# Patient Record
Sex: Female | Born: 1990 | Race: White | Hispanic: No | Marital: Single | Smoking: Current every day smoker
Health system: Southern US, Community
[De-identification: ages and names within clinical notes are randomized; demographics above are authoritative.]

## PROBLEM LIST (undated history)

## (undated) ENCOUNTER — Inpatient Hospital Stay: Payer: Self-pay

## (undated) HISTORY — PX: FEMUR SURGERY: SHX943

## (undated) HISTORY — PX: ORBITAL FRACTURE SURGERY: SHX725

---

## 2011-01-25 ENCOUNTER — Emergency Department: Payer: Self-pay | Admitting: Emergency Medicine

## 2012-01-15 IMAGING — CR DG FEMUR 2V*R*
1 series · 5 of 5 positions shown · non-contrast
Comparison: none

REASON FOR EXAM: pain in area where rod is placed, feelslike leg is bowing
COMMENTS:   LMP: 2 weeks, no sex active

[Series 1: view not recorded · 0.17mm/px · 5 of 5 slices shown]
[im 1/5]
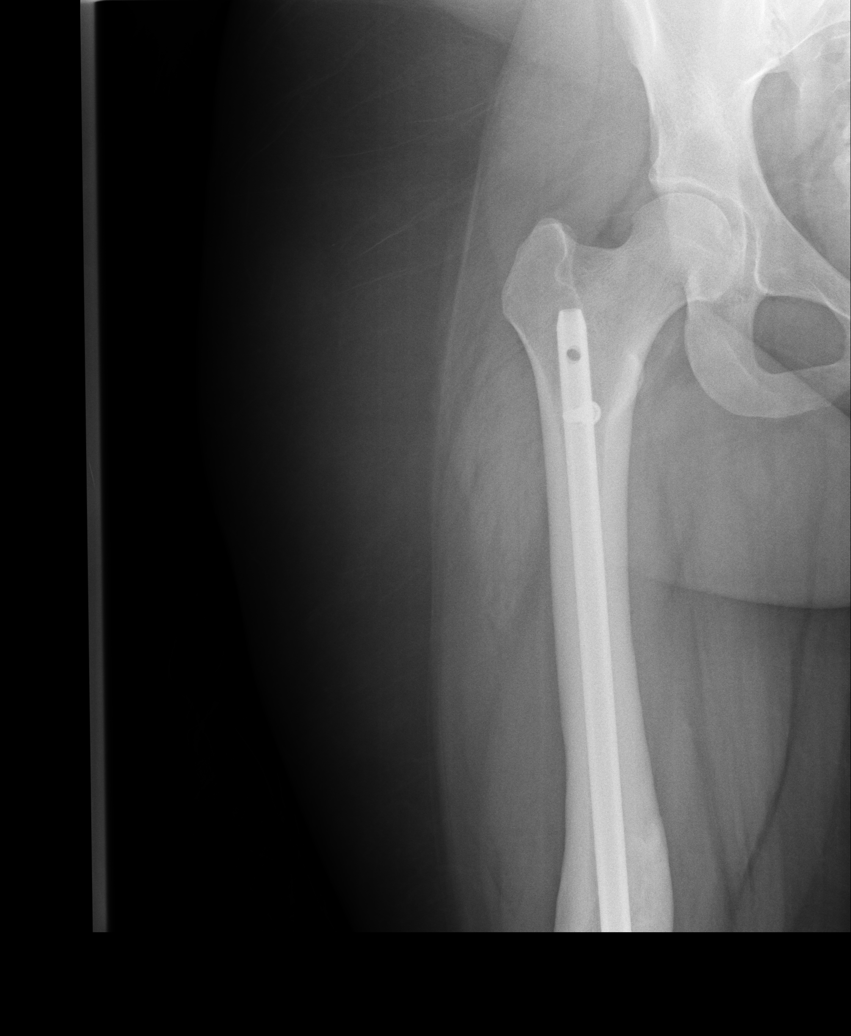
[im 2/5]
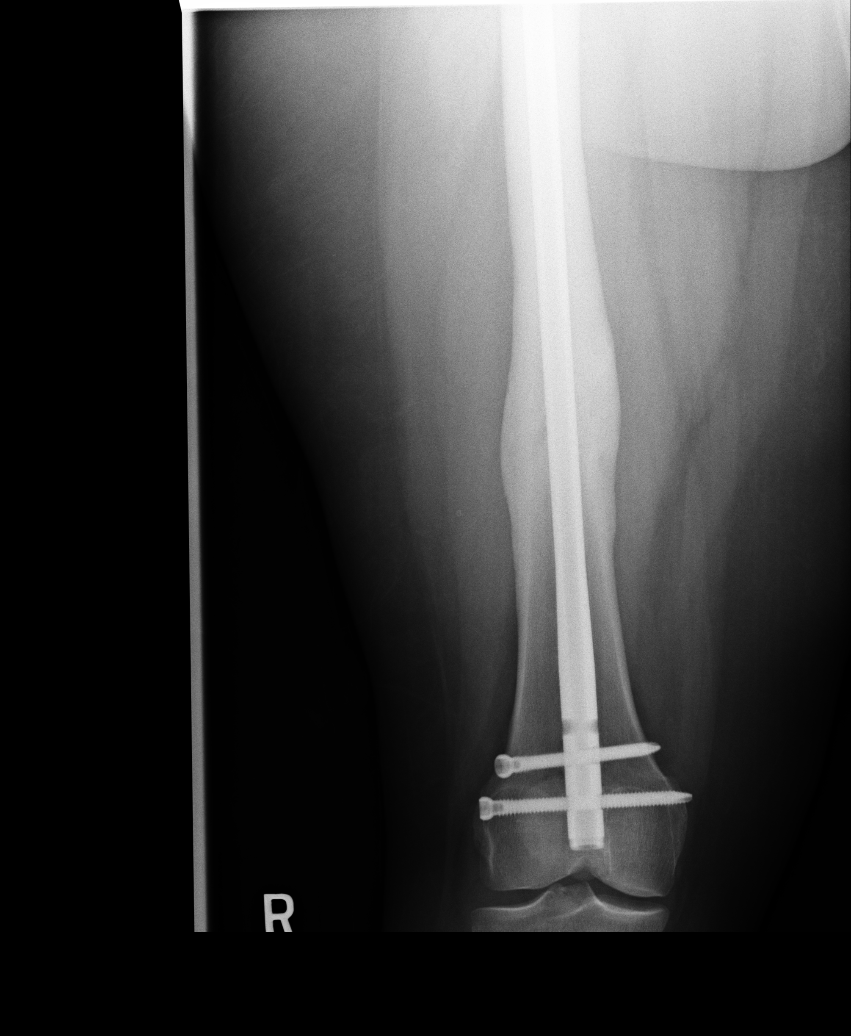
[im 3/5]
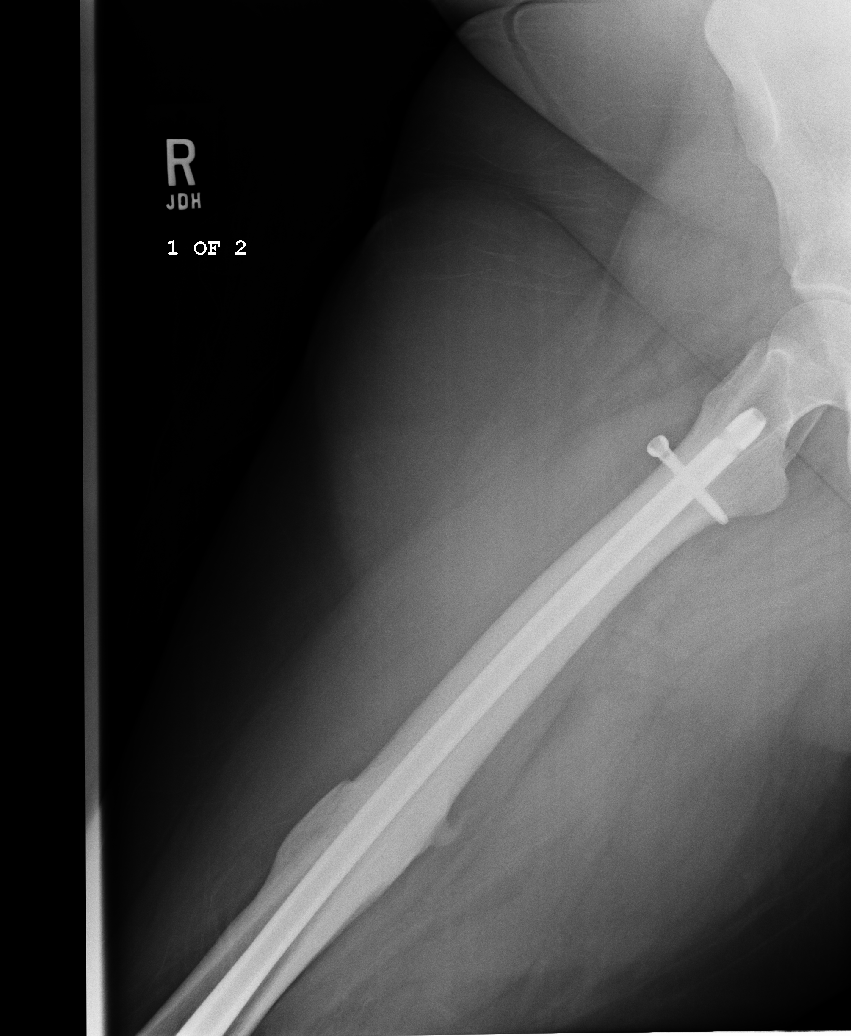
[im 4/5]
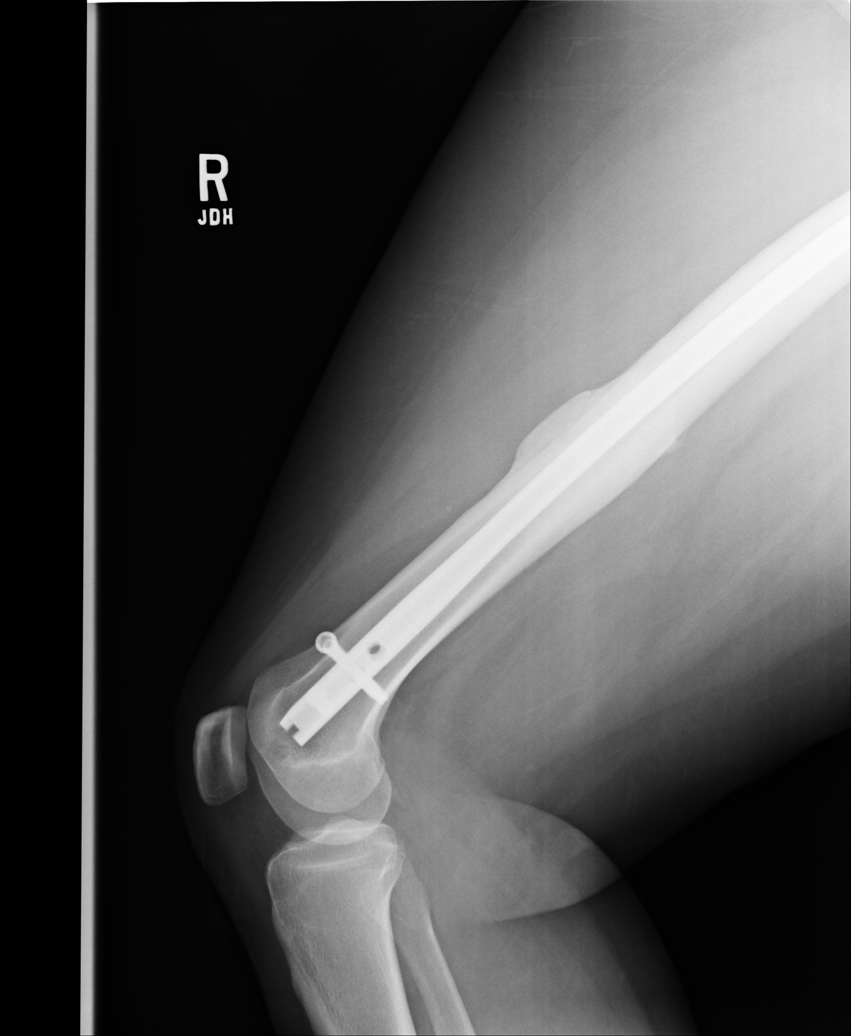
[im 5/5]
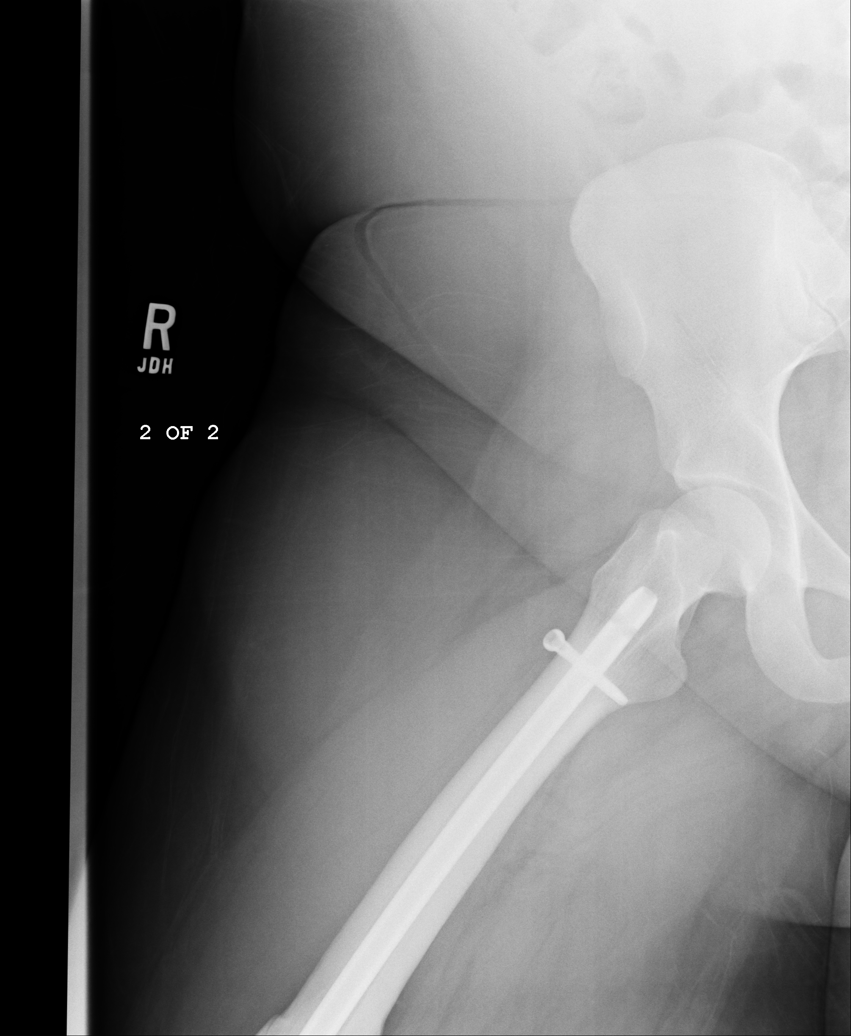

[5 of 5 positions shown; findings below may reference images not displayed]

PROCEDURE:     DXR - DXR FEMUR RIGHT  - January 25, 2011  [DATE]

RESULT:     Findings: AP and lateral views of the right femur demonstrates
no acute fracture or dislocation. There is an intramedullary nail with
proximal and distal transverse interlocking screws transfixing a healed
distal femoral diaphyseal fracture. The soft tissues are normal.
IMPRESSION: Please see above.

## 2012-08-22 ENCOUNTER — Emergency Department: Payer: Self-pay | Admitting: Emergency Medicine

## 2012-08-22 LAB — URINALYSIS, COMPLETE
Bilirubin,UR: NEGATIVE
Glucose,UR: NEGATIVE mg/dL (ref 0–75)
Nitrite: NEGATIVE
Protein: NEGATIVE
Specific Gravity: 1.029 (ref 1.003–1.030)
Squamous Epithelial: 47

## 2012-08-22 LAB — COMPREHENSIVE METABOLIC PANEL
Albumin: 3.9 g/dL (ref 3.4–5.0)
Anion Gap: 7 (ref 7–16)
BUN: 12 mg/dL (ref 7–18)
Calcium, Total: 8.7 mg/dL (ref 8.5–10.1)
Creatinine: 0.61 mg/dL (ref 0.60–1.30)
EGFR (African American): >60
SGOT(AST): 16 U/L (ref 15–37)
SGPT (ALT): 23 U/L (ref 12–78)
Sodium: 140 mmol/L (ref 136–145)

## 2012-08-22 LAB — CBC
HCT: 45.5 % (ref 35.0–47.0)
MCHC: 34.3 g/dL (ref 32.0–36.0)
MCV: 91 fL (ref 80–100)
Platelet: 206 10*3/uL (ref 150–440)
RBC: 4.98 10*6/uL (ref 3.80–5.20)
RDW: 12.6 % (ref 11.5–14.5)
WBC: 8.6 10*3/uL (ref 3.6–11.0)

## 2012-08-22 LAB — LIPASE, BLOOD: Lipase: 85 U/L (ref 73–393)

## 2013-10-06 ENCOUNTER — Ambulatory Visit: Payer: Self-pay

## 2013-10-08 ENCOUNTER — Ambulatory Visit: Payer: Self-pay

## 2014-07-01 NOTE — L&D Delivery Note (Signed)
Deliver Note   Date of Delivery:   12/30/2014 Primary OB:   [facility] Gestational Age/EDD: 7262w6d by 12/31/2014, by Other Basis  Antepartum complications:  OB History    Gravida Para Term Preterm AB TAB SAB Ectopic Multiple Living   1 1 1       0 1      Delivered By:   Vena AustriaStaebler, Roxene Alviar MD  Delivery Type:   TSVD Anesthesia:     None  Intrapartum complications:  GBS:    Negative (06/10 0000) Laceration:    none Episiotomy:    none Placenta:    Spontaneous Estimated Blood Loss:  250mL Baby:    Liveborn female , APGAR (1 MIN): 8   APGAR (5 MINS): 9   APGAR (10 MINS):  , weight 5lbs 10oz   Deliver Details   At  a female was delivered via  (Presentation:OA ).  APGAR: 8, 9; weight .   Placenta status: manual extraction, intact.  3 Vessel Cord:  with the following complications: meconium    Mom to postpartum.  Baby to Couplet care / Skin to Skin.

## 2014-08-25 ENCOUNTER — Emergency Department: Payer: Self-pay | Admitting: Emergency Medicine

## 2014-09-26 IMAGING — US ULTRASOUND LEFT BREAST
1 series · 7 of 7 positions shown · non-contrast
Comparison: None

ADDENDUM:
The patient returns for possible left breast ultrasound-guided
aspiration/core biopsy.

On repeat ultrasound evaluation of the area of palpable concern
within the left breast, the subdermal, hypoechoic fluid collection
is again evaluated. This is located in the region of the left
areolar border at the 2 o'clock position. On today's evaluation a
small sinus tract is visualized. Color-flow imaging shows slightly
increased color flow around the periphery of the area without color
flow within the lesion. The appearance is most consistent with a
sebaceous cyst ([REDACTED] gland cyst) in this region. As result,
ultrasound-guided core biopsy/ aspiration will not be performed this
time. The patient has been instructed that if this increases in size
or develops signs of infection, to contact our [REDACTED] and we
can arrange surgical referral for surgical excision of the lesion,
if needed. Breast self-examination was reviewed with the patient. I
recommend screening mammography at age 40.
2: Benign finding(s).
CLINICAL DATA: Patient presents for evaluation of palpable
thickening within the subareolar aspect of the left breast 2 o'clock
position.
EXAM:
ULTRASOUND OF THE LEFT BREAST

[Series 1: ultrasound left breast · 0.08mm/px · 7 of 7 slices shown]
[im 1/7]
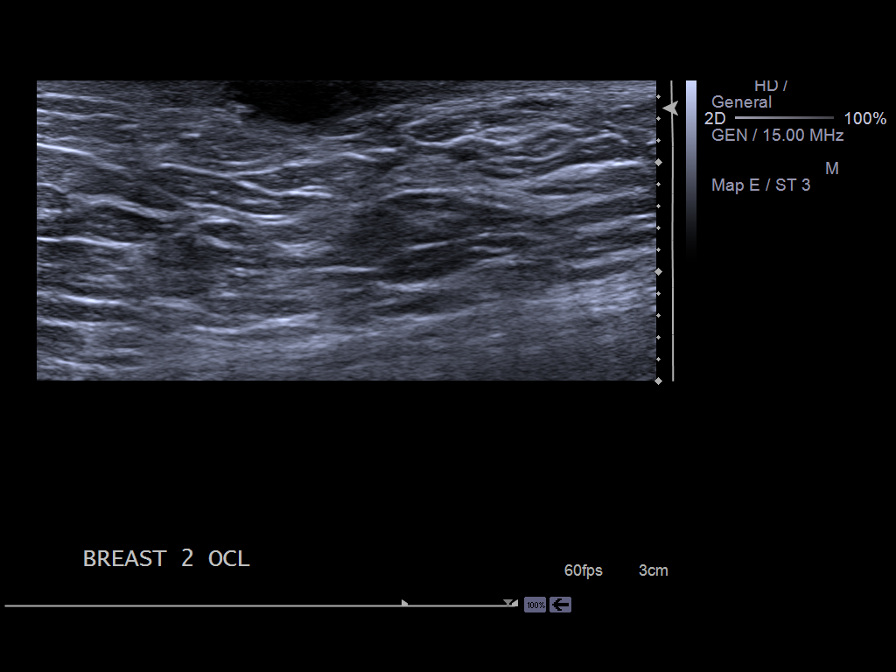
[im 2/7]
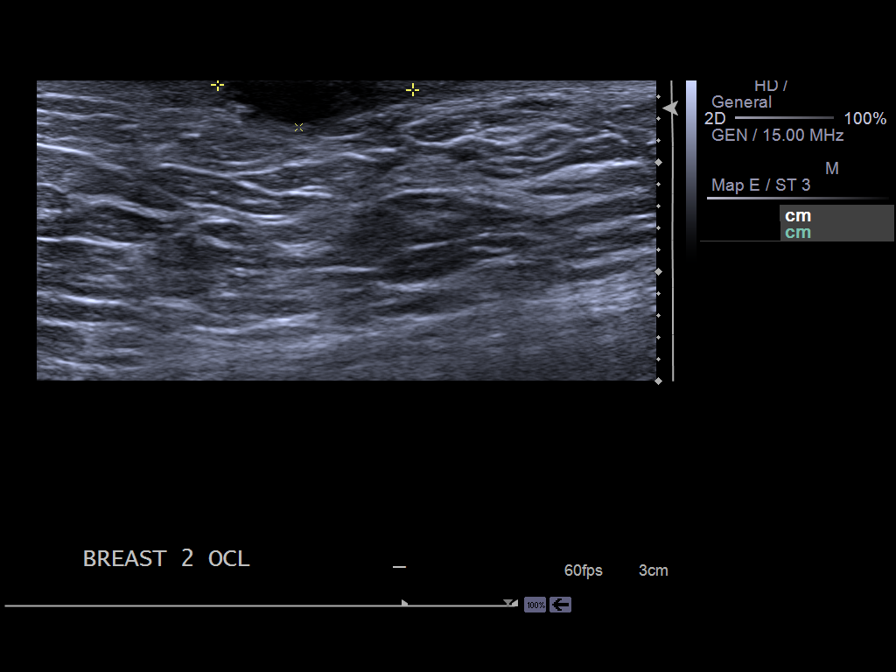
[im 3/7]
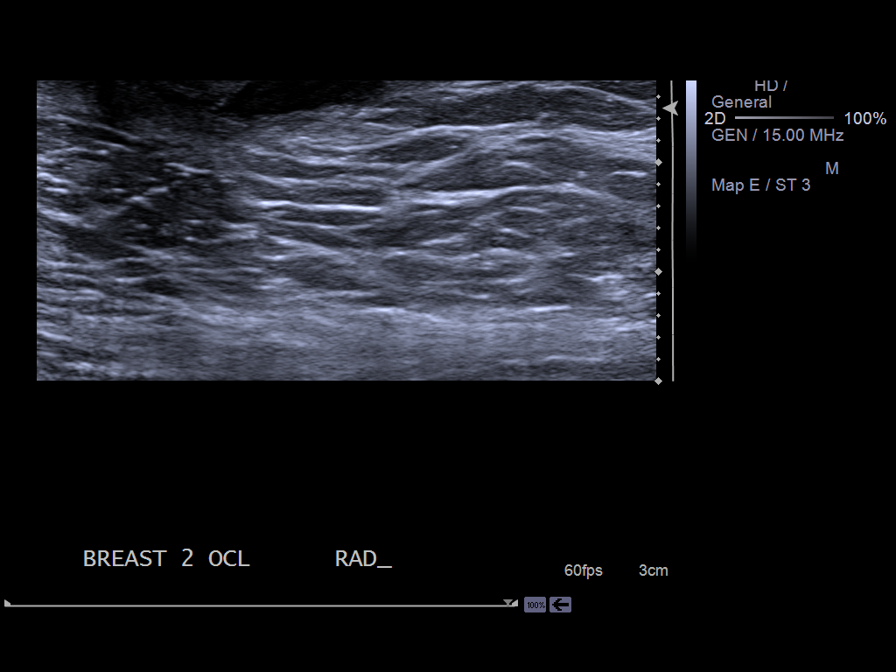
[im 4/7]
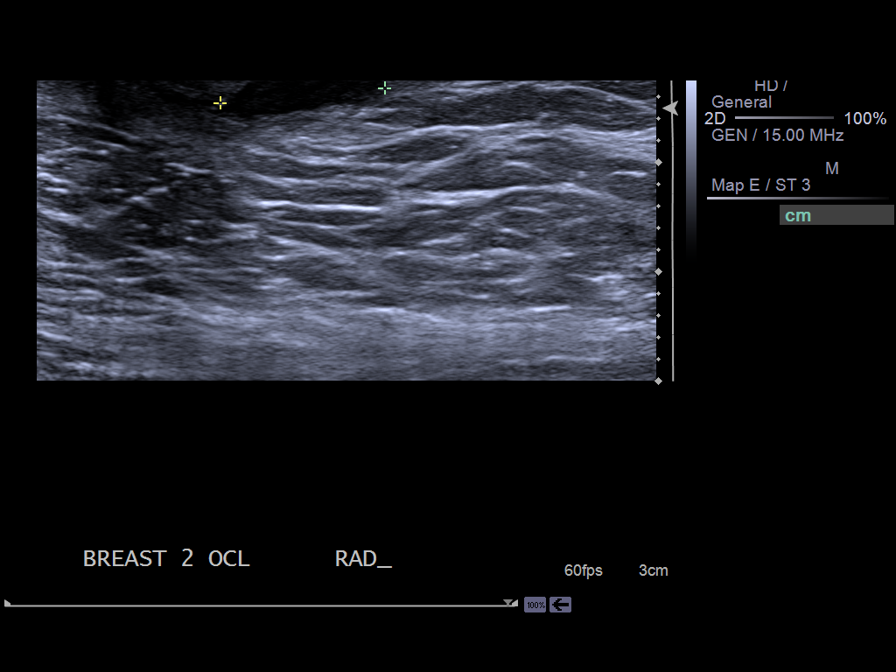
[im 5/7]
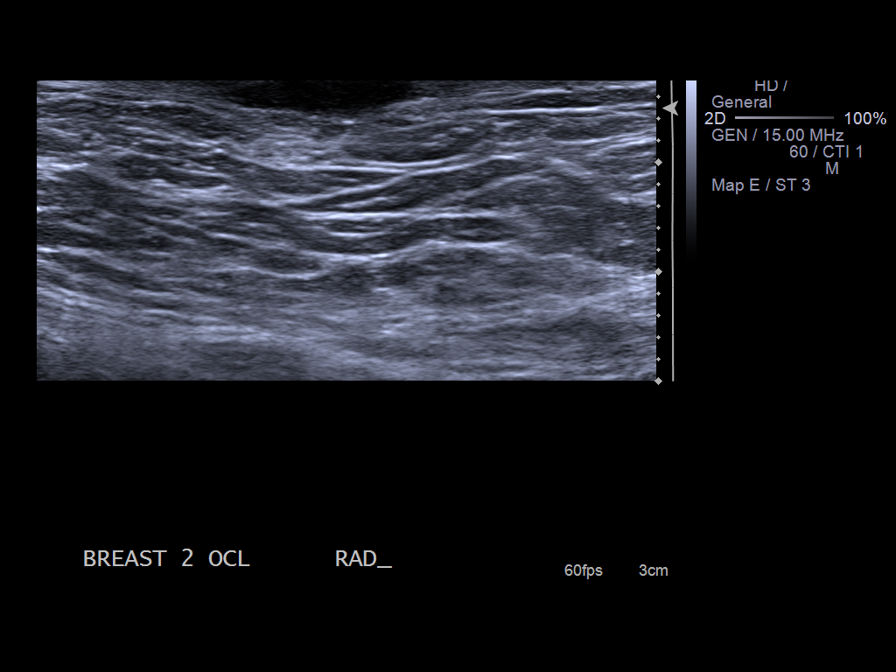
[im 6/7]
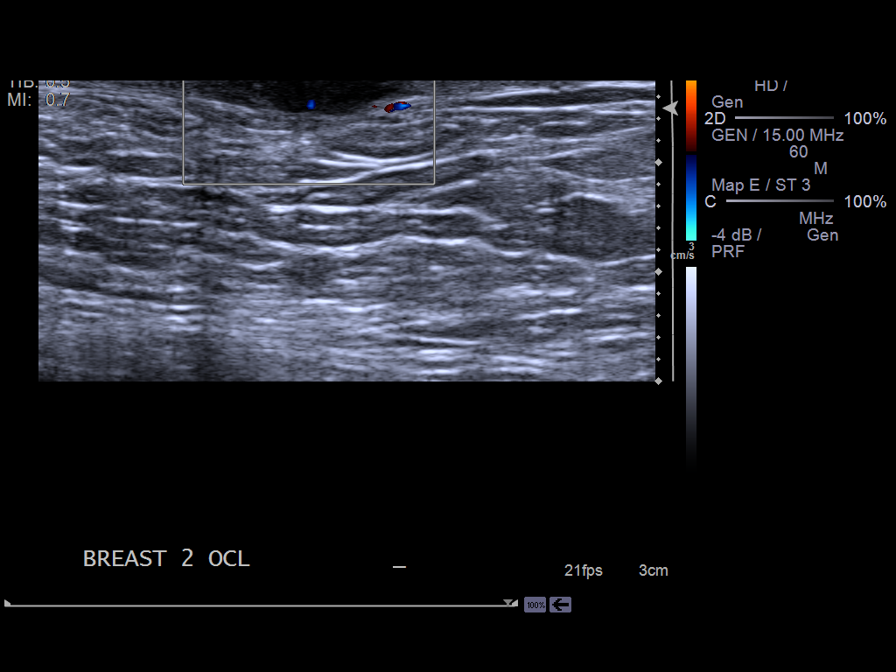
[im 7/7]
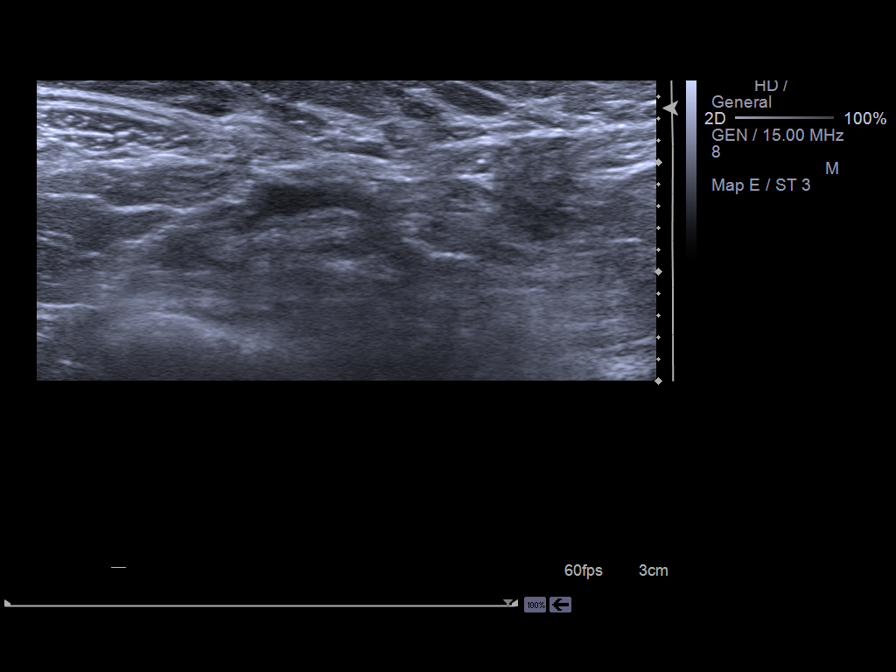

[7 of 7 positions shown; findings below may reference images not displayed]

FINDINGS: On physical exam,I palpate an area thickening 2 o'clock position
subareolar location..

Ultrasound is performed, showing a 1.8 x 0.5 x 1.5 cm hypoechoic
collection/mass within the subareolar left breast 2 o'clock position
corresponding with palpable abnormality. There is overlying skin
thickening. Additionally there is suggestion of peripheral color
vascular blood flow.
IMPRESSION: Palpable abnormality corresponds with a complicated fluid collection
versus solid mass within the subareolar left breast 2 o'clock
position. There is suggestion of peripheral vascular flow.

RECOMMENDATION:
Ultrasound-guided cyst aspiration/ biopsy of subareolar mass versus
fluid collection. This is scheduled for 10/08/2013 at 1 p.m..

I have discussed the findings and recommendations with the patient.
Results were also provided in writing at the conclusion of the
visit. If applicable, a reminder letter will be sent to the patient
regarding the next appointment.

BI-RADS CATEGORY  3: Probably benign finding(s) - short interval
follow-up suggested.

## 2014-09-28 IMAGING — US US BREAST CYST ASPIRATION 1ST CYST
1 series · 4 of 4 positions shown · non-contrast
Comparison: none

CLINICAL DATA: 2: Benign finding(s).

[Series 1: us breast cyst aspiration 1st cyst · 0.08mm/px · 4 of 4 slices shown]
[im 1/4]
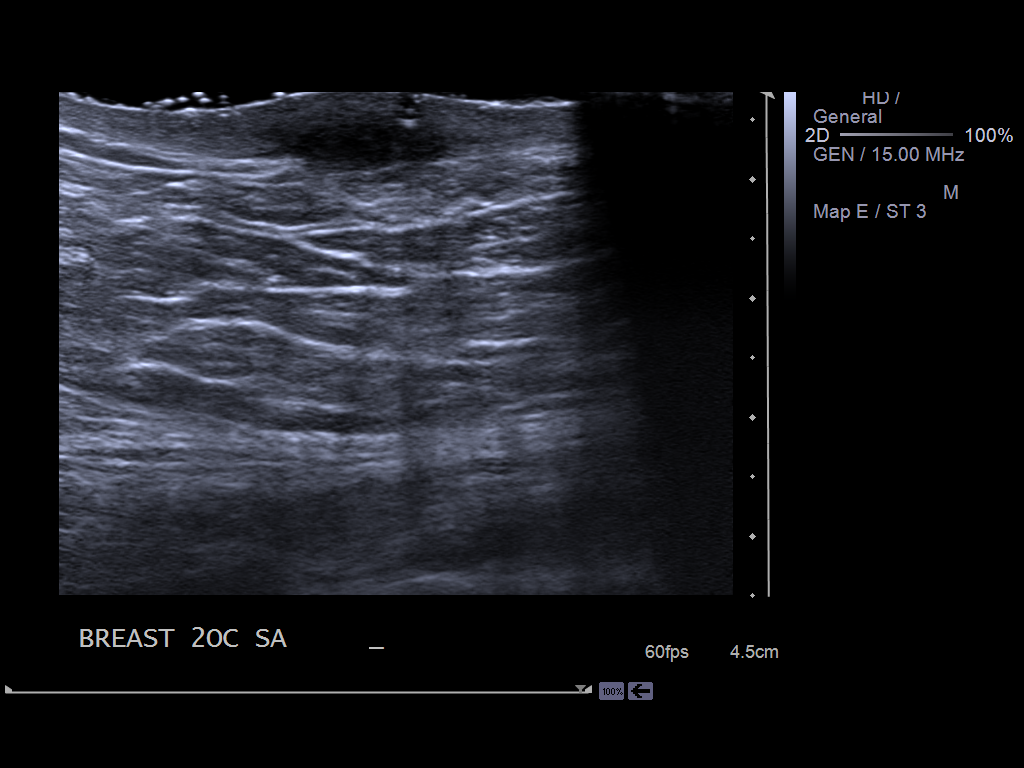
[im 2/4]
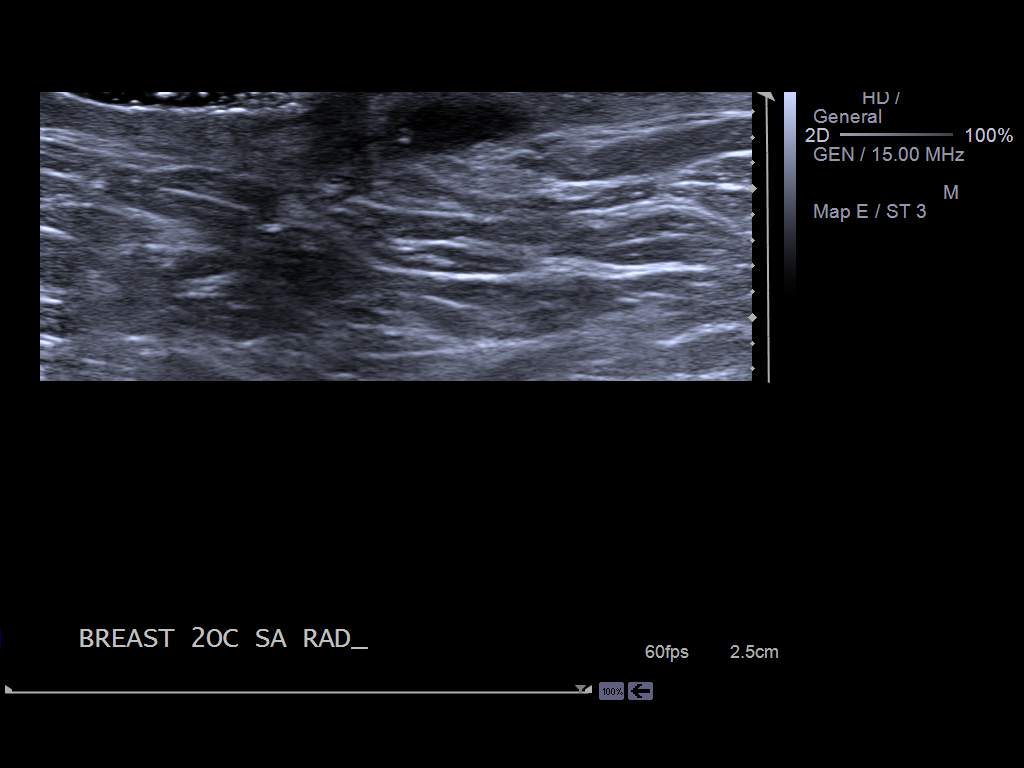
[im 3/4]
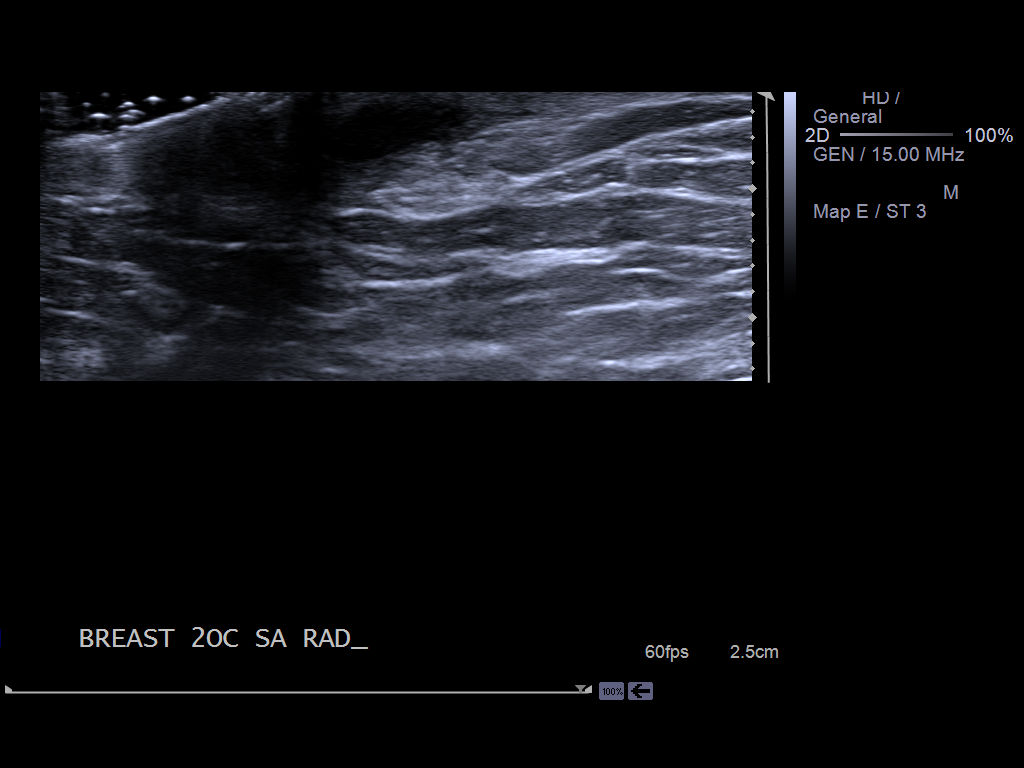
[im 4/4]
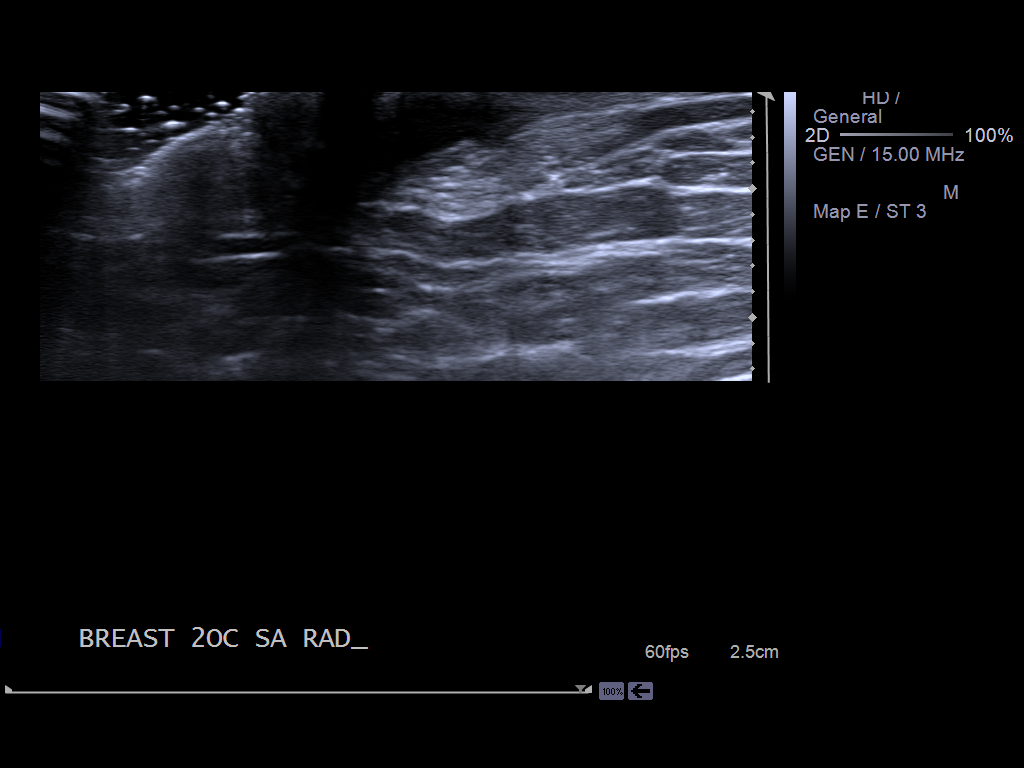

[4 of 4 positions shown; findings below may reference images not displayed]

RIGHT BREAST ULTRASOUND WAS PERFORMED PRIOR TO POSSIBLE RIGHT BREAST ULTRASOUND-GUIDED ASPIRATION/CORE BIOPSY.:
RIGHT BREAST ULTRASOUND WAS PERFORMED PRIOR TO POSSIBLE RIGHT BREAST
ULTRASOUND-GUIDED ASPIRATION/CORE BIOPSY.

Comparison with previous examinations performed.

PROCEDURE:
No core biopsy was performed.  Please see following evaluation:

On repeat ultrasound evaluation of the area of palpable concern
within the left breast, the subdermal, hypoechoic fluid collection
is again evaluated. This is located in the region of the left
areolar border at the 2 o'clock position. On today's evaluation a
small sinus tract is visualized. Color-flow imaging shows slightly
increased color flow around the periphery of the area without color
flow within the lesion. The appearance is most consistent with a
sebaceous cyst ([REDACTED] gland cyst) in this region. As result,
ultrasound-guided core biopsy/ aspiration will not be performed this
time. The patient has been instructed that if this increases in size
or develops signs of infection, to contact our [REDACTED] and we
can arrange surgical referral for surgical excision of the lesion,
if needed. Breast self-examination was reviewed with the patient. I
recommend screening mammography at age 40.
IMPRESSION: Left breast [REDACTED] gland cyst.

2: Benign finding(s).

## 2014-10-19 LAB — OB RESULTS CONSOLE RPR: RPR: NONREACTIVE

## 2014-10-19 LAB — OB RESULTS CONSOLE HIV ANTIBODY (ROUTINE TESTING): HIV: NONREACTIVE

## 2014-10-19 LAB — OB RESULTS CONSOLE ABO/RH: RH Type: NEGATIVE

## 2014-10-19 LAB — OB RESULTS CONSOLE RUBELLA ANTIBODY, IGM: Rubella: IMMUNE

## 2014-10-19 LAB — OB RESULTS CONSOLE GC/CHLAMYDIA
Chlamydia: NEGATIVE
GC PROBE AMP, GENITAL: NEGATIVE

## 2014-10-19 LAB — OB RESULTS CONSOLE HEPATITIS B SURFACE ANTIGEN: HEP B S AG: NEGATIVE

## 2014-10-19 LAB — OB RESULTS CONSOLE VARICELLA ZOSTER ANTIBODY, IGG: Varicella: IMMUNE

## 2014-12-09 LAB — OB RESULTS CONSOLE GBS: GBS: NEGATIVE

## 2014-12-09 LAB — OB RESULTS CONSOLE GC/CHLAMYDIA
Chlamydia: NEGATIVE
Gonorrhea: NEGATIVE

## 2014-12-28 ENCOUNTER — Encounter: Payer: Self-pay | Admitting: *Deleted

## 2014-12-28 ENCOUNTER — Observation Stay
Admission: EM | Admit: 2014-12-28 | Discharge: 2014-12-28 | Disposition: A | Discharge status: Home / Self Care | Payer: Medicaid Other | Attending: Advanced Practice Midwife | Admitting: Advanced Practice Midwife

## 2014-12-28 DIAGNOSIS — Z3A39 39 weeks gestation of pregnancy: Secondary | ICD-10-CM | POA: Insufficient documentation

## 2014-12-28 DIAGNOSIS — O368190 Decreased fetal movements, unspecified trimester, not applicable or unspecified: Secondary | ICD-10-CM | POA: Diagnosis present

## 2014-12-28 DIAGNOSIS — O368130 Decreased fetal movements, third trimester, not applicable or unspecified: Principal | ICD-10-CM | POA: Insufficient documentation

## 2014-12-28 NOTE — Discharge Instructions (Signed)
Vaginal Delivery °During delivery, your health care provider will help you give birth to your baby. During a vaginal delivery, you will work to push the baby out of your vagina. However, before you can push your baby out, a few things need to happen. The opening of your uterus (cervix) has to soften, thin out, and open up (dilate) all the way to 10 cm. Also, your baby has to move down from the uterus into your vagina.  °SIGNS OF LABOR  °Your health care provider will first need to make sure you are in labor. Signs of labor include:  °· Passing what is called the mucous plug before labor begins. This is a small amount of blood-stained mucus. °· Having regular, painful uterine contractions.   °· The time between contractions gets shorter.   °· The discomfort and pain gradually get more intense. °· Contraction pains get worse when walking and do not go away when resting.   °· Your cervix becomes thinner (effacement) and dilates. °BEFORE THE DELIVERY °Once you are in labor and admitted into the hospital or care center, your health care provider may do the following:  °· Perform a complete physical exam. °· Review any complications related to pregnancy or labor.  °· Check your blood pressure, pulse, temperature, and heart rate (vital signs).   °· Determine if, and when, the rupture of amniotic membranes occurred. °· Do a vaginal exam (using a sterile glove and lubricant) to determine:   °¨ The position (presentation) of the baby. Is the baby's head presenting first (vertex) in the birth canal (vagina), or are the feet or buttocks first (breech)?   °¨ The level (station) of the baby's head within the birth canal.   °¨ The effacement and dilatation of the cervix.   °· An electronic fetal monitor is usually placed on your abdomen when you first arrive. This is used to monitor your contractions and the baby's heart rate. °¨ When the monitor is on your abdomen (external fetal monitor), it can only pick up the frequency and  length of your contractions. It cannot tell the strength of your contractions. °¨ If it becomes necessary for your health care provider to know exactly how strong your contractions are or to see exactly what the baby's heart rate is doing, an internal monitor may be inserted into your vagina and uterus. Your health care provider will discuss the benefits and risks of using an internal monitor and obtain your permission before inserting the device. °¨ Continuous fetal monitoring may be needed if you have an epidural, are receiving certain medicines (such as oxytocin), or have pregnancy or labor complications. °· An IV access tube may be placed into a vein in your arm to deliver fluids and medicines if necessary. °THREE STAGES OF LABOR AND DELIVERY °Normal labor and delivery is divided into three stages. °First Stage °This stage starts when you begin to contract regularly and your cervix begins to efface and dilate. It ends when your cervix is completely open (fully dilated). The first stage is the longest stage of labor and can last from 3 hours to 15 hours.  °Several methods are available to help with labor pain. You and your health care provider will decide which option is best for you. Options include:  °· Opioid medicines. These are strong pain medicines that you can get through your IV tube or as a shot into your muscle. These medicines lessen pain but do not make it go away completely.  °· Epidural. A medicine is given through a thin tube that   is inserted in your back. The medicine numbs the lower part of your body and prevents any pain in that area.  Paracervical pain medicine. This is an injection of an anesthetic on each side of your cervix.   You may request natural childbirth, which does not involve the use of pain medicines or an epidural during labor and delivery. Instead, you will use other things, such as breathing exercises, to help cope with the pain. Second Stage The second stage of labor  begins when your cervix is fully dilated at 10 cm. It continues until you push your baby down through the birth canal and the baby is born. This stage can take only minutes or several hours.  The location of your baby's head as it moves through the birth canal is reported as a number called a station. If the baby's head has not started its descent, the station is described as being at minus 3 (-3). When your baby's head is at the zero station, it is at the middle of the birth canal and is engaged in the pelvis. The station of your baby helps indicate the progress of the second stage of labor.  When your baby is born, your health care provider may hold the baby with his or her head lowered to prevent amniotic fluid, mucus, and blood from getting into the baby's lungs. The baby's mouth and nose may be suctioned with a small bulb syringe to remove any additional fluid.  Your health care provider may then place the baby on your stomach. It is important to keep the baby from getting cold. To do this, the health care provider will dry the baby off, place the baby directly on your skin (with no blankets between you and the baby), and cover the baby with warm, dry blankets.   The umbilical cord is cut. Third Stage During the third stage of labor, your health care provider will deliver the placenta (afterbirth) and make sure your bleeding is under control. The delivery of the placenta usually takes about 5 minutes but can take up to 30 minutes. After the placenta is delivered, a medicine may be given either by IV or injection to help contract the uterus and control bleeding. If you are planning to breastfeed, you can try to do so now. After you deliver the placenta, your uterus should contract and get very firm. If your uterus does not remain firm, your health care provider will massage it. This is important because the contraction of the uterus helps cut off bleeding at the site where the placenta was attached  to your uterus. If your uterus does not contract properly and stay firm, you may continue to bleed heavily. If there is a lot of bleeding, medicines may be given to contract the uterus and stop the bleeding.  Document Released: 03/26/2008 Document Revised: 11/01/2013 Document Reviewed: 12/06/2012 [facility] Patient Information 2015 [employer], [place]. This information is not intended to replace advice given to you by your health care provider. Make sure you discuss any questions you have with your health care provider. Braxton Hicks Contractions Contractions of the uterus can occur throughout pregnancy. Contractions are not always a sign that you are in labor.  WHAT ARE BRAXTON HICKS CONTRACTIONS?  Contractions that occur before labor are called Braxton Hicks contractions, or false labor. Toward the end of pregnancy (32-34 weeks), these contractions can develop more often and may become more forceful. This is not true labor because these contractions do not result in opening (  dilatation) and thinning of the cervix. They are sometimes difficult to tell apart from true labor because these contractions can be forceful and people have different pain tolerances. You should not feel embarrassed if you go to the hospital with false labor. Sometimes, the only way to tell if you are in true labor is for your health care provider to look for changes in the cervix. If there are no prenatal problems or other health problems associated with the pregnancy, it is completely safe to be sent home with false labor and await the onset of true labor. HOW CAN YOU TELL THE DIFFERENCE BETWEEN TRUE AND FALSE LABOR? False Labor  The contractions of false labor are usually shorter and not as hard as those of true labor.   The contractions are usually irregular.   The contractions are often felt in the front of the lower abdomen and in the groin.   The contractions may go away when you walk around or change positions while  lying down.   The contractions get weaker and are shorter lasting as time goes on.   The contractions do not usually become progressively stronger, regular, and closer together as with true labor.  True Labor  Contractions in true labor last 30-70 seconds, become very regular, usually become more intense, and increase in frequency.   The contractions do not go away with walking.   The discomfort is usually felt in the top of the uterus and spreads to the lower abdomen and low back.   True labor can be determined by your health care provider with an exam. This will show that the cervix is dilating and getting thinner.  WHAT TO REMEMBER  Keep up with your usual exercises and follow other instructions given by your health care provider.   Take medicines as directed by your health care provider.   Keep your regular prenatal appointments.   Eat and drink lightly if you think you are going into labor.   If Braxton Hicks contractions are making you uncomfortable:   Change your position from lying down or resting to walking, or from walking to resting.   Sit and rest in a tub of warm water.   Drink 2-3 glasses of water. Dehydration may cause these contractions.   Do slow and deep breathing several times an hour.  WHEN SHOULD I SEEK IMMEDIATE MEDICAL CARE? Seek immediate medical care if:  Your contractions become stronger, more regular, and closer together.   You have fluid leaking or gushing from your vagina.   You have a fever.   You pass blood-tinged mucus.   You have vaginal bleeding.   You have continuous abdominal pain.   You have low back pain that you never had before.   You feel your baby's head pushing down and causing pelvic pressure.   Your baby is not moving as much as it used to.  Document Released: 06/17/2005 Document Revised: 06/22/2013 Document Reviewed: 03/29/2013 [facility] Patient Information 2015 [employer], [place]. This  information is not intended to replace advice given to you by your health care provider. Make sure you discuss any questions you have with your health care provider. LABOR: When contractions begin, you should start to time them from the beginning of one contraction to the beginning of the next.  When contractions are 5-10 minutes apart or less and have been regular for at least an hour, you should call your health care provider.  Notify your doctor if any of the following occur: 1.  Bleeding from the vagina 7. Sudden, constant, or occasional abdominal pain  2. Pain or burning when urinating 8. Sudden gushing of fluid from the vagina (with or without continued leaking)  3. Chills or fever 9. Fainting spells, "black outs" or loss of consciousness  4. Increase in vaginal discharge 10. Severe or continued nausea or vomiting  5. Pelvic pressure (sudden increase) 11. Blurring of vision or spots before the eyes  6. Baby moving less than usual 12. Leaking of fluid    FETAL KICK COUNT: Lie on your left side for one hour after a meal, and count the number of times your baby kicks. If it is less than 5 times, get up, move around and drink some juice. Repeat the test 30 minutes later. If it is still less than 5 kicks in an hour, notify your doctor.

## 2014-12-28 NOTE — Progress Notes (Signed)
Obstetric History and Physical  Alisha Park is a 24 y.o. G1P0 with Estimated Date of Delivery: 12/31/14 per 7wk US who presents at 6167w4d  presenting for decreased FM. Reports she typically feels baby move several times an hour, but has not felt any movement since this am. She denies change in activity today or decresed po intake. Denies regular ctx, LOF or VB.     Prenatal Course Source of Care: [facility]  with onset of care at 10 weeks Pregnancy complications or risks: Patient Active Problem List   Diagnosis Date Noted  . Decreased fetal movement 12/28/2014   She plans to breastfeed She desires IUD for postpartum contraception.   Prenatal labs and studies: ABO, Rh: B neg  Antibody: neg Rubella: Immune Varicella: Immune RPR:  neg HBsAg:  neg HIV: neg GC/CT: neg/neg GBS: neg 1 hr Glucola: 120   Genetic screening: negative 1st trimester screen    Prenatal Transfer Tool   No past medical history on file.  No past surgical history on file.  OB History  Gravida Para Term Preterm AB SAB TAB Ectopic Multiple Living  1             # Outcome Date GA Lbr Len/2nd Weight Sex Delivery Anes PTL Lv  1 Current               History   Social History  . Marital Status: Single    Spouse Name: N/A  . Number of Children: N/A  . Years of Education: N/A   Social History Main Topics  . Smoking status: Not on file  . Smokeless tobacco: Not on file  . Alcohol Use: Not on file  . Drug Use: Not on file  . Sexual Activity: Not on file   Other Topics Concern  . Not on file   Social History Narrative  . No narrative on file    No family history on file.  No prescriptions prior to admission    No Known Allergies  Review of Systems: Negative except for what is mentioned in HPI.  Physical Exam: BP 121/71 mmHg  Pulse 89  Temp(Src) 98.2 F (36.8 C) (Oral)  Resp 18 GENERAL: Well-developed, well-nourished female in no acute distress.  LUNGS: Clear to auscultation bilaterally.   HEART: Regular rate and rhythm. ABDOMEN: Soft, nontender, nondistended, gravid. EXTREMITIES: Nontender, no edema Cervical Exam: Dilatation 1 cm   Effacement 50 %   Station -3   Presentation: cephalic FHT: Category 1: Baseline rate 145 bpm   Variability moderate  Accelerations present   Decelerations none Contractions: irregular   Pertinent Labs/Studies:   No results found for this or any previous visit (from the past 24 hour(s)).  Assessment : IUP at 767w4d, decreased FM, category 1 fetal tracing  Plan: Discharge Home after 1 hour of reactive NST.  Pt has ROB appt tomorrow. Monitor fetal movement from now til then, if continued decreased FM may consider repeat NST and or BPP tomorrow in clinic. Pt encouraged to maintain po hydration.

## 2014-12-28 NOTE — OB Triage Note (Signed)
Patient states she has not felt the baby move since last night at 2230.

## 2014-12-30 ENCOUNTER — Encounter: Payer: Self-pay | Admitting: Certified Nurse Midwife

## 2014-12-30 ENCOUNTER — Encounter
Admission: EM | Disposition: A | Discharge status: Home / Self Care | Payer: Self-pay | Attending: Obstetrics and Gynecology

## 2014-12-30 ENCOUNTER — Observation Stay
Admission: EM | Admit: 2014-12-30 | Discharge: 2014-12-30 | Disposition: A | Discharge status: Home / Self Care | Payer: Medicaid Other | Source: Home / Self Care | Admitting: Obstetrics and Gynecology

## 2014-12-30 ENCOUNTER — Inpatient Hospital Stay
Admission: EM | Admit: 2014-12-30 | Discharge: 2015-01-01 | DRG: 775 | Disposition: A | Discharge status: Home / Self Care | Payer: Medicaid Other | Source: Intra-hospital | Attending: Obstetrics and Gynecology | Admitting: Obstetrics and Gynecology

## 2014-12-30 DIAGNOSIS — F17210 Nicotine dependence, cigarettes, uncomplicated: Secondary | ICD-10-CM | POA: Diagnosis present

## 2014-12-30 DIAGNOSIS — M25551 Pain in right hip: Secondary | ICD-10-CM | POA: Diagnosis present

## 2014-12-30 DIAGNOSIS — R262 Difficulty in walking, not elsewhere classified: Secondary | ICD-10-CM | POA: Diagnosis present

## 2014-12-30 DIAGNOSIS — Z9889 Other specified postprocedural states: Secondary | ICD-10-CM | POA: Diagnosis not present

## 2014-12-30 DIAGNOSIS — O368190 Decreased fetal movements, unspecified trimester, not applicable or unspecified: Secondary | ICD-10-CM | POA: Diagnosis present

## 2014-12-30 DIAGNOSIS — O99334 Smoking (tobacco) complicating childbirth: Secondary | ICD-10-CM | POA: Diagnosis present

## 2014-12-30 DIAGNOSIS — Z3A39 39 weeks gestation of pregnancy: Secondary | ICD-10-CM | POA: Diagnosis present

## 2014-12-30 LAB — CBC
HCT: 43.6 % (ref 35.0–47.0)
HEMOGLOBIN: 14.9 g/dL (ref 12.0–16.0)
MCH: 31 pg (ref 26.0–34.0)
MCHC: 34.1 g/dL (ref 32.0–36.0)
MCV: 90.9 fL (ref 80.0–100.0)
PLATELETS: 228 10*3/uL (ref 150–440)
RBC: 4.8 MIL/uL (ref 3.80–5.20)
RDW: 13 % (ref 11.5–14.5)
WBC: 21.2 10*3/uL — AB (ref 3.6–11.0)

## 2014-12-30 LAB — ABO/RH: ABO/RH(D): B NEG

## 2014-12-30 LAB — TYPE AND SCREEN
ABO/RH(D): B NEG
ANTIBODY SCREEN: NEGATIVE

## 2014-12-30 SURGERY — Surgical Case
Anesthesia: Choice

## 2014-12-30 MED ORDER — BUTORPHANOL TARTRATE 1 MG/ML IJ SOLN
INTRAMUSCULAR | Status: AC
  Administered 2014-12-30: 2 mg
  Filled 2014-12-30: qty 2

## 2014-12-30 MED ORDER — OXYTOCIN 10 UNIT/ML IJ SOLN
INTRAMUSCULAR | Status: AC
  Filled 2014-12-30: qty 2

## 2014-12-30 MED ORDER — ACETAMINOPHEN 325 MG PO TABS: 650.0000 mg | ORAL_TABLET | ORAL | Status: DC | PRN

## 2014-12-30 MED ORDER — LIDOCAINE HCL (PF) 1 % IJ SOLN
30.0000 mL | INTRAMUSCULAR | Status: DC | PRN
  Filled 2014-12-30: qty 30

## 2014-12-30 MED ORDER — MISOPROSTOL 200 MCG PO TABS
ORAL_TABLET | ORAL | Status: AC
  Filled 2014-12-30: qty 4

## 2014-12-30 MED ORDER — OXYTOCIN BOLUS FROM INFUSION: 500.0000 mL | INTRAVENOUS | Status: DC

## 2014-12-30 MED ORDER — ONDANSETRON HCL 4 MG/2ML IJ SOLN: 4.0000 mg | Freq: Four times a day (QID) | INTRAMUSCULAR | Status: DC | PRN

## 2014-12-30 MED ORDER — AMMONIA AROMATIC IN INHA
RESPIRATORY_TRACT | Status: AC
  Filled 2014-12-30: qty 10

## 2014-12-30 MED ORDER — BUTORPHANOL TARTRATE 1 MG/ML IJ SOLN
INTRAMUSCULAR | Status: AC
  Administered 2014-12-30: 1 mg
  Filled 2014-12-30: qty 1

## 2014-12-30 MED ORDER — FENTANYL CITRATE (PF) 100 MCG/2ML IJ SOLN
INTRAMUSCULAR | Status: AC
  Administered 2014-12-30: 100 ug via INTRAVENOUS
  Filled 2014-12-30: qty 2

## 2014-12-30 MED ORDER — BUTORPHANOL TARTRATE 1 MG/ML IJ SOLN: 1.0000 mg | INTRAMUSCULAR | Status: DC | PRN

## 2014-12-30 MED ORDER — LACTATED RINGERS IV SOLN: 500.0000 mL | INTRAVENOUS | Status: DC | PRN

## 2014-12-30 MED ORDER — OXYTOCIN 40 UNITS IN LACTATED RINGERS INFUSION - SIMPLE MED
62.5000 mL/h | INTRAVENOUS | Status: DC
  Administered 2014-12-30: 62.5 mL/h via INTRAVENOUS

## 2014-12-30 MED ORDER — LACTATED RINGERS IV SOLN
INTRAVENOUS | Status: DC
  Administered 2014-12-30: 125 mL/h via INTRAVENOUS

## 2014-12-30 MED ORDER — LIDOCAINE HCL (PF) 1 % IJ SOLN
INTRAMUSCULAR | Status: AC
  Filled 2014-12-30: qty 30

## 2014-12-30 MED ORDER — OXYTOCIN 40 UNITS IN LACTATED RINGERS INFUSION - SIMPLE MED
INTRAVENOUS | Status: AC
  Administered 2014-12-30: 62.5 mL/h via INTRAVENOUS
  Filled 2014-12-30: qty 1000

## 2014-12-30 SURGICAL SUPPLY — 25 items
BAG COUNTER SPONGE EZ (MISCELLANEOUS) IMPLANT
CANISTER SUCT 3000ML (MISCELLANEOUS) IMPLANT
CATH KIT ON-Q SILVERSOAK 5IN (CATHETERS) IMPLANT
CHLORAPREP W/TINT 26ML (MISCELLANEOUS) IMPLANT
CLOSURE WOUND 1/2 X4 (GAUZE/BANDAGES/DRESSINGS)
COUNTER SPONGE BAG EZ (MISCELLANEOUS)
DRSG TELFA 3X8 NADH (GAUZE/BANDAGES/DRESSINGS) IMPLANT
ELECT CAUTERY BLADE 6.4 (BLADE) IMPLANT
GAUZE SPONGE 4X4 12PLY STRL (GAUZE/BANDAGES/DRESSINGS) IMPLANT
GLOVE BIO SURGEON STRL SZ7 (GLOVE) IMPLANT
GLOVE INDICATOR 7.5 STRL GRN (GLOVE) IMPLANT
GOWN STRL REUS W/ TWL LRG LVL3 (GOWN DISPOSABLE) IMPLANT
GOWN STRL REUS W/TWL LRG LVL3 (GOWN DISPOSABLE)
LIQUID BAND (GAUZE/BANDAGES/DRESSINGS) IMPLANT
NS IRRIG 1000ML POUR BTL (IV SOLUTION) IMPLANT
PACK C SECTION AR (MISCELLANEOUS) IMPLANT
PAD GROUND ADULT SPLIT (MISCELLANEOUS) IMPLANT
PAD OB MATERNITY 4.3X12.25 (PERSONAL CARE ITEMS) IMPLANT
PAD PREP 24X41 OB/GYN DISP (PERSONAL CARE ITEMS) IMPLANT
STRIP CLOSURE SKIN 1/2X4 (GAUZE/BANDAGES/DRESSINGS) IMPLANT
SUT MNCRL AB 4-0 PS2 18 (SUTURE) IMPLANT
SUT PDS AB 1 TP1 96 (SUTURE) IMPLANT
SUT VIC AB 0 CTX 36 (SUTURE)
SUT VIC AB 0 CTX36XBRD ANBCTRL (SUTURE) IMPLANT
SUT VIC AB 2-0 CT1 36 (SUTURE) IMPLANT
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
## 2014-12-30 NOTE — OB Triage Note (Signed)
Pt arrives from ED with c/o contractions since 0400, intermittent ctx, 5-7 min apart. Reports having membranes stripped yesterday by CLG, CNM. Denies bleeding, LOF. Rates pain 8/10.

## 2014-12-30 NOTE — Discharge Summary (Signed)
Obstetric Discharge Summary Reason for Admission: onset of labor and rupture of membranes Prenatal Procedures: NST Intrapartum Procedures: spontaneous vaginal delivery Postpartum Procedures: none Complications-Operative and Postpartum: none HEMOGLOBIN  Date Value Ref Range Status  12/30/2014 14.9 12.0 - 16.0 g/dL Final   HGB  Date Value Ref Range Status  08/22/2012 15.6 12.0-16.0 g/dL Final   HCT  Date Value Ref Range Status  12/30/2014 43.6 35.0 - 47.0 % Final  08/22/2012 45.5 35.0-47.0 % Final    Physical Exam:  General: alert Lochia: appropriate Uterine Fundus: firm Incision: N/A DVT Evaluation: No evidence of DVT seen on physical exam.  Discharge Diagnoses: Term Pregnancy-delivered  Discharge Information: Date: 12/30/2014 Activity: pelvic rest Diet: routine Medications: PNV, Tylenol #3 and Ibuprofen Condition: stable Instructions: Discharge instructions:   Call office if you have any of the following: headache, visual changes, fever >100 F, chills, breast concerns, excessive vaginal bleeding, incision drainage or problems, leg pain or redness, depression or any other concerns.   Activity: Do not lift > 10 lbs for 6 weeks.  No intercourse or tampons for 6 weeks.  No driving for 1-2 weeks.   Discharge to: home   Newborn Data: Live born female  Birth Weight: 5 lb 11 oz (2580 g) APGAR: 8, 9  Home with mother.   Marta Antuamara Gradie Butrick, CNM

## 2014-12-31 LAB — CBC
HCT: 40.4 % (ref 35.0–47.0)
HEMOGLOBIN: 13.5 g/dL (ref 12.0–16.0)
MCH: 30.7 pg (ref 26.0–34.0)
MCHC: 33.4 g/dL (ref 32.0–36.0)
MCV: 92.1 fL (ref 80.0–100.0)
PLATELETS: 231 10*3/uL (ref 150–440)
RBC: 4.39 MIL/uL (ref 3.80–5.20)
RDW: 13.2 % (ref 11.5–14.5)
WBC: 31.2 10*3/uL — AB (ref 3.6–11.0)

## 2014-12-31 LAB — RPR: RPR: NONREACTIVE

## 2014-12-31 LAB — FETAL SCREEN: Fetal Screen: NEGATIVE

## 2014-12-31 MED ORDER — SIMETHICONE 80 MG PO CHEW: 80.0000 mg | CHEWABLE_TABLET | ORAL | Status: DC | PRN

## 2014-12-31 MED ORDER — ONDANSETRON HCL 4 MG PO TABS: 4.0000 mg | ORAL_TABLET | ORAL | Status: DC | PRN

## 2014-12-31 MED ORDER — DIBUCAINE 1 % RE OINT: 1.0000 "application " | TOPICAL_OINTMENT | RECTAL | Status: DC | PRN

## 2014-12-31 MED ORDER — OXYCODONE-ACETAMINOPHEN 5-325 MG PO TABS: 2.0000 | ORAL_TABLET | ORAL | Status: DC | PRN

## 2014-12-31 MED ORDER — ACETAMINOPHEN-CODEINE #3 300-30 MG PO TABS
1.0000 | ORAL_TABLET | Freq: Four times a day (QID) | ORAL | Status: DC | PRN
  Administered 2014-12-31: 1 via ORAL
  Filled 2014-12-31: qty 1

## 2014-12-31 MED ORDER — OXYCODONE-ACETAMINOPHEN 5-325 MG PO TABS: 1.0000 | ORAL_TABLET | ORAL | Status: DC | PRN

## 2014-12-31 MED ORDER — FENTANYL CITRATE (PF) 100 MCG/2ML IJ SOLN
100.0000 ug | Freq: Once | INTRAMUSCULAR | Status: AC
  Administered 2014-12-30: 100 ug via INTRAVENOUS

## 2014-12-31 MED ORDER — DIPHENHYDRAMINE HCL 25 MG PO CAPS: 25.0000 mg | ORAL_CAPSULE | Freq: Four times a day (QID) | ORAL | Status: DC | PRN

## 2014-12-31 MED ORDER — BENZOCAINE-MENTHOL 20-0.5 % EX AERO: 1.0000 "application " | INHALATION_SPRAY | CUTANEOUS | Status: DC | PRN

## 2014-12-31 MED ORDER — WITCH HAZEL-GLYCERIN EX PADS: 1.0000 "application " | MEDICATED_PAD | CUTANEOUS | Status: DC | PRN

## 2014-12-31 MED ORDER — IBUPROFEN 600 MG PO TABS
600.0000 mg | ORAL_TABLET | Freq: Four times a day (QID) | ORAL | Status: DC
  Administered 2014-12-31 – 2015-01-01 (×4): 600 mg via ORAL
  Filled 2014-12-31 (×4): qty 1

## 2014-12-31 MED ORDER — PRENATAL MULTIVITAMIN CH
1.0000 | ORAL_TABLET | Freq: Every day | ORAL | Status: DC
  Administered 2015-01-01: 1 via ORAL
  Filled 2014-12-31: qty 1

## 2014-12-31 MED ORDER — ACETAMINOPHEN 325 MG PO TABS: 650.0000 mg | ORAL_TABLET | ORAL | Status: DC | PRN

## 2014-12-31 MED ORDER — SENNOSIDES-DOCUSATE SODIUM 8.6-50 MG PO TABS: 2.0000 | ORAL_TABLET | ORAL | Status: DC

## 2014-12-31 MED ORDER — RHO D IMMUNE GLOBULIN 1500 UNIT/2ML IJ SOSY
300.0000 ug | PREFILLED_SYRINGE | Freq: Once | INTRAMUSCULAR | Status: AC
  Administered 2014-12-31: 300 ug via INTRAMUSCULAR
  Filled 2014-12-31: qty 2

## 2014-12-31 MED ORDER — ONDANSETRON HCL 4 MG/2ML IJ SOLN: 4.0000 mg | INTRAMUSCULAR | Status: DC | PRN

## 2014-12-31 MED ORDER — LANOLIN HYDROUS EX OINT: TOPICAL_OINTMENT | CUTANEOUS | Status: DC | PRN
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
## 2015-01-01 LAB — RHOGAM INJECTION: Unit division: 0

## 2015-01-01 MED ORDER — ACETAMINOPHEN-CODEINE #3 300-30 MG PO TABS: 1.0000 | ORAL_TABLET | Freq: Four times a day (QID) | ORAL | Status: AC | PRN

## 2015-01-01 MED ORDER — NORETHINDRONE 0.35 MG PO TABS: 1.0000 | ORAL_TABLET | Freq: Every day | ORAL | Status: AC

## 2015-01-01 MED ORDER — IBUPROFEN 600 MG PO TABS: 600.0000 mg | ORAL_TABLET | Freq: Four times a day (QID) | ORAL | Status: AC

## 2015-01-01 NOTE — Discharge Instructions (Signed)
Discharge instructions:  ° °Call office if you have any of the following: headache, visual changes, fever >100 F, chills, breast concerns, excessive vaginal bleeding, incision drainage or problems, leg pain or redness, depression or any other concerns.  ° °Activity: Do not lift > 10 lbs for 6 weeks.  °No intercourse or tampons for 6 weeks.  °No driving for 1-2 weeks.  ° °Call your doctor for increased pain or vaginal bleeding, temperature above 100.4, depression, or concerns.  No strenuous activity or heavy lifting for 6 weeks.  No intercourse, tampons, douching, or enemas for 6 weeks.  No tub baths-showers only.  No driving for 2 weeks or while taking pain medications.  Continue prenatal vitamin and iron.  Increase calories and fluids while breastfeeding. °

## 2015-01-01 NOTE — Progress Notes (Signed)
Prenatal records indicate that pt received TDaP vaccine on 10/19/14.  Pt declines Influenza vaccine. Reynold BowenSusan Paisley Kelita Wallis, RN 01/01/2015 1:28 PM

## 2015-01-01 NOTE — Progress Notes (Signed)
Discharge instructions provided.  Pt and sig other verbalize understanding of all instructions and follow-up care.  Prescriptions given.  Pt discharged to home with infant at 1550 on 01/01/15 via wheelchair by RN. Reynold BowenSusan Paisley Randee Upchurch, RN 01/01/2015 3:53 PM

## 2019-02-15 ENCOUNTER — Other Ambulatory Visit: Payer: Self-pay

## 2019-02-15 DIAGNOSIS — Z20822 Contact with and (suspected) exposure to covid-19: Secondary | ICD-10-CM

## 2019-02-17 LAB — NOVEL CORONAVIRUS, NAA: SARS-CoV-2, NAA: NOT DETECTED
